# Patient Record
Sex: Female | Born: 2000 | Race: Black or African American | Hispanic: No | Marital: Single | Smoking: Current some day smoker
Health system: Southern US, Community
[De-identification: ages and names within clinical notes are randomized; demographics above are authoritative.]

## PROBLEM LIST (undated history)

## (undated) DIAGNOSIS — R519 Headache, unspecified: Secondary | ICD-10-CM

## (undated) DIAGNOSIS — R51 Headache: Secondary | ICD-10-CM

## (undated) DIAGNOSIS — F419 Anxiety disorder, unspecified: Secondary | ICD-10-CM

---
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
## 2021-07-02 ENCOUNTER — Encounter: Payer: Self-pay | Admitting: Obstetrics and Gynecology

## 2021-07-19 ENCOUNTER — Encounter: Payer: Self-pay | Admitting: Obstetrics & Gynecology

## 2022-08-17 ENCOUNTER — Ambulatory Visit: Payer: Medicaid Other | Admitting: Family Medicine

## 2023-11-04 ENCOUNTER — Encounter (HOSPITAL_COMMUNITY): Payer: Self-pay

## 2023-11-04 ENCOUNTER — Inpatient Hospital Stay (HOSPITAL_COMMUNITY)
Admission: EM | Admit: 2023-11-04 | Discharge: 2023-11-07 | DRG: 918 | Disposition: A | Discharge status: Other Acute Inpatient Hospital | Attending: Internal Medicine | Admitting: Internal Medicine

## 2023-11-04 ENCOUNTER — Other Ambulatory Visit: Payer: Self-pay

## 2023-11-04 DIAGNOSIS — R059 Cough, unspecified: Secondary | ICD-10-CM | POA: Diagnosis present

## 2023-11-04 DIAGNOSIS — T43621A Poisoning by amphetamines, accidental (unintentional), initial encounter: Secondary | ICD-10-CM | POA: Diagnosis present

## 2023-11-04 DIAGNOSIS — F1290 Cannabis use, unspecified, uncomplicated: Secondary | ICD-10-CM | POA: Diagnosis present

## 2023-11-04 DIAGNOSIS — Z79899 Other long term (current) drug therapy: Secondary | ICD-10-CM | POA: Diagnosis not present

## 2023-11-04 DIAGNOSIS — D72829 Elevated white blood cell count, unspecified: Secondary | ICD-10-CM | POA: Diagnosis present

## 2023-11-04 DIAGNOSIS — F17290 Nicotine dependence, other tobacco product, uncomplicated: Secondary | ICD-10-CM | POA: Diagnosis present

## 2023-11-04 DIAGNOSIS — R451 Restlessness and agitation: Secondary | ICD-10-CM | POA: Diagnosis present

## 2023-11-04 DIAGNOSIS — F419 Anxiety disorder, unspecified: Secondary | ICD-10-CM | POA: Diagnosis present

## 2023-11-04 DIAGNOSIS — Z9151 Personal history of suicidal behavior: Secondary | ICD-10-CM | POA: Diagnosis not present

## 2023-11-04 DIAGNOSIS — J45909 Unspecified asthma, uncomplicated: Secondary | ICD-10-CM | POA: Diagnosis present

## 2023-11-04 DIAGNOSIS — T391X4A Poisoning by 4-Aminophenol derivatives, undetermined, initial encounter: Secondary | ICD-10-CM | POA: Diagnosis present

## 2023-11-04 DIAGNOSIS — R000 Tachycardia, unspecified: Secondary | ICD-10-CM | POA: Diagnosis present

## 2023-11-04 DIAGNOSIS — F333 Major depressive disorder, recurrent, severe with psychotic symptoms: Secondary | ICD-10-CM | POA: Diagnosis present

## 2023-11-04 DIAGNOSIS — T50904A Poisoning by unspecified drugs, medicaments and biological substances, undetermined, initial encounter: Principal | ICD-10-CM | POA: Diagnosis present

## 2023-11-04 DIAGNOSIS — R0682 Tachypnea, not elsewhere classified: Secondary | ICD-10-CM | POA: Diagnosis present

## 2023-11-04 DIAGNOSIS — E876 Hypokalemia: Secondary | ICD-10-CM | POA: Diagnosis present

## 2023-11-04 DIAGNOSIS — T43634A Poisoning by methylphenidate, undetermined, initial encounter: Principal | ICD-10-CM | POA: Diagnosis present

## 2023-11-04 DIAGNOSIS — Z1152 Encounter for screening for COVID-19: Secondary | ICD-10-CM | POA: Diagnosis not present

## 2023-11-04 DIAGNOSIS — F332 Major depressive disorder, recurrent severe without psychotic features: Secondary | ICD-10-CM | POA: Diagnosis present

## 2023-11-04 LAB — COMPREHENSIVE METABOLIC PANEL
ALT: 15 U/L (ref 0–44)
AST: 22 U/L (ref 15–41)
Albumin: 4 g/dL (ref 3.5–5.0)
Alkaline Phosphatase: 106 U/L (ref 38–126)
Anion gap: 12 (ref 5–15)
BUN: 9 mg/dL (ref 6–20)
CO2: 22 mmol/L (ref 22–32)
Calcium: 9.1 mg/dL (ref 8.9–10.3)
Chloride: 101 mmol/L (ref 98–111)
Creatinine, Ser: 0.98 mg/dL (ref 0.44–1.00)
GFR, Estimated: >60 mL/min (ref >60)
Glucose, Bld: 103 mg/dL — ABNORMAL HIGH (ref 70–99)
Potassium: 3.2 mmol/L — ABNORMAL LOW (ref 3.5–5.1)
Sodium: 135 mmol/L (ref 135–145)
Total Bilirubin: 1.8 mg/dL — ABNORMAL HIGH (ref 0.0–1.2)
Total Protein: 8 g/dL (ref 6.5–8.1)

## 2023-11-04 LAB — HCG, SERUM, QUALITATIVE: Preg, Serum: NEGATIVE

## 2023-11-04 LAB — ETHANOL: Alcohol, Ethyl (B): <10 mg/dL (ref <10)

## 2023-11-04 LAB — ACETAMINOPHEN LEVEL: Acetaminophen (Tylenol), Serum: <10 ug/mL — ABNORMAL LOW (ref 10–30)

## 2023-11-04 LAB — SALICYLATE LEVEL: Salicylate Lvl: <7 mg/dL — ABNORMAL LOW (ref 7.0–30.0)

## 2023-11-04 MED ORDER — LORAZEPAM 2 MG/ML IJ SOLN
1.0000 mg | Freq: Once | INTRAMUSCULAR | Status: AC
  Administered 2023-11-04: 1 mg via INTRAVENOUS
  Filled 2023-11-04: qty 1

## 2023-11-04 MED ORDER — POTASSIUM CHLORIDE 10 MEQ/100ML IV SOLN
10.0000 meq | INTRAVENOUS | Status: AC
  Administered 2023-11-05 (×4): 10 meq via INTRAVENOUS
  Filled 2023-11-04 (×4): qty 100

## 2023-11-04 MED ORDER — MAGNESIUM SULFATE 2 GM/50ML IV SOLN
2.0000 g | Freq: Once | INTRAVENOUS | Status: AC
  Administered 2023-11-05: 2 g via INTRAVENOUS
  Filled 2023-11-04: qty 50

## 2023-11-04 MED ORDER — SODIUM CHLORIDE 0.9 % IV BOLUS
1000.0000 mL | Freq: Once | INTRAVENOUS | Status: AC
  Administered 2023-11-04: 1000 mL via INTRAVENOUS

## 2023-11-04 MED ORDER — SODIUM CHLORIDE 0.9 % IV SOLN: INTRAVENOUS | Status: DC

## 2023-11-04 MED ORDER — MAGNESIUM CHLORIDE 64 MG PO TBEC
1.0000 | DELAYED_RELEASE_TABLET | Freq: Once | ORAL | Status: DC
  Filled 2023-11-04: qty 1

## 2023-11-04 MED ORDER — ZIPRASIDONE MESYLATE 20 MG IM SOLR
20.0000 mg | Freq: Once | INTRAMUSCULAR | Status: AC
  Administered 2023-11-05: 20 mg via INTRAMUSCULAR
  Filled 2023-11-04: qty 20

## 2023-11-04 MED ORDER — POTASSIUM CHLORIDE CRYS ER 20 MEQ PO TBCR
40.0000 meq | EXTENDED_RELEASE_TABLET | Freq: Once | ORAL | Status: AC
  Administered 2023-11-04: 40 meq via ORAL
  Filled 2023-11-04: qty 2

## 2023-11-04 NOTE — ED Notes (Signed)
 This RN spoke with poison control regarding the pt ingesting 120 mL of Quillivant solution PTA. Poison control recommended repeating EKG, tylenol level, mag, and CK 4 hours after initial levels were drawn. They warned to look out for urinary retention, elevated temperature, and possible seizure like activity and agitation. Recommended to be liberal with the benzo dosing, and replace mag and potassium to high levels (potassium to 4.2 and mag to 2.2). Recommended to observe for a minimum of 8 hours. Recommended that if QRS widens, given bicarb boluses.

## 2023-11-04 NOTE — ED Triage Notes (Signed)
 BIB EMS from home for potential overdose. Pt was dropped off at grandmothers house yesterday, pt was missing for a few hours, when she came back she had drank all of her ADHD meds. Grandmother reports pt talking about self harming herself. Pt has racing thoughts. Pt denies SI/HI. Pt refused all vitals except BP and HR. Pt c/o HA.

## 2023-11-04 NOTE — H&P (Addendum)
 History and Physical    Patient: Kelly Baker GNF:[id number] DOB: 10/09/2000 DOA: 11/04/2023 DOS: the patient was seen and examined on 11/04/2023 PCP: Dahlia Byes, MD  Patient coming from: Home  Chief Complaint:  Chief Complaint  Patient presents with   Ingestion   HPI: Kelly Baker is a 23 y.o. female with medical history significant of depression, anxiety, asthma, who was brought in with confusion after ingestion of 120 mL of Quillivant solution and unknown quantities of methylphenidate tablets,  as well as Tylenol.  The grandmother called EMS due to concern for bizarre behavior.  She has been having racing thoughts and pressured speech.  Patient denies suicidal or homicidal intent but family reported that she was trying to hurt herself.  She was equally committed to behavioral health for suicide attempt in 2019.  History is difficult to obtain from the patient as she is not rational and unable to speak straight.  Patient seen in the ER and evaluated.  She has significant tachycardia and tachypnea.  Also mild hypokalemia.  No nausea vomiting or diarrhea.  At this point Poison control has been contacted.  Patient being admitted to the hospital for observation and monitoring.  Review of Systems: As mentioned in the history of present illness. All other systems reviewed and are negative. Past Medical History:  Diagnosis Date   Anxiety    Asthma    Headache    History reviewed. No pertinent surgical history. Social History:  reports that she has been smoking e-cigarettes. She has never used smokeless tobacco. She reports current alcohol use. She reports current drug use.  Allergies  Allergen Reactions   Other Other (See Comments)    Dog dander pt reports sneezing    Family History  Problem Relation Age of Onset   Healthy Mother     Prior to Admission medications   Medication Sig Start Date End Date Taking? Authorizing Provider  albuterol (VENTOLIN HFA) 108 (90  Base) MCG/ACT inhaler  04/08/19   [provider]  cetirizine (ZYRTEC) 10 MG tablet TK 1 T PO HS 04/08/19   [provider]  etonogestrel (NEXPLANON) 68 MG IMPL implant 1 each (68 mg total) by Subdermal route once.    Verneda Skill, FNP  ondansetron (ZOFRAN ODT) 4 MG disintegrating tablet Take 1 tablet (4 mg total) by mouth every 8 (eight) hours as needed for nausea or vomiting. 12/30/19   Vicki Mallet, MD  polyethylene glycol powder (GLYCOLAX/MIRALAX) powder 1 capful in 8 oz of liquid po daily x 2 weeks, then 1/2 capful as needed for constipation and pain Patient not taking: Reported on 01/31/2018 07/23/11   Niel Hummer, MD    Physical Exam: Vitals:   11/04/23 2116 11/04/23 2126 11/04/23 2215 11/04/23 2245  BP: (!) 140/88     Pulse: (!) 114  (!) 129   Resp:   19 (!) 23  SpO2:   100%   Weight:  49.9 kg    Height:  5\' 2"  (1.575 m)     Constitutional: Confused with pressured speech and difficult historian,  Eyes: PERRL, lids and conjunctivae normal ENMT: Mucous membranes are moist. Posterior pharynx clear of any exudate or lesions.Normal dentition.  Neck: normal, supple, no masses, no thyromegaly Respiratory: clear to auscultation bilaterally, no wheezing, no crackles. Normal respiratory effort. No accessory muscle use.  Cardiovascular: Sinus tachycardia, no murmurs / rubs / gallops. No extremity edema. 2+ pedal pulses. No carotid bruits.  Abdomen: no tenderness, no masses palpated. No hepatosplenomegaly. Bowel  sounds positive.  Musculoskeletal: Good range of motion, no joint swelling or tenderness, Skin: no rashes, lesions, ulcers. No induration Neurologic: CN 2-12 grossly intact. Sensation intact, DTR normal. Strength 5/5 in all 4.  Psychiatric: Mild confusion, tangential, pressured speech  Data Reviewed:  Blood pressure 140/88 pulse 129, respiratory rate of 23, potassium 3.2 glucose 103 .  Tylenol level less than 10 salicylate level less than 7 glucose 103  pregnancy test is negative urine drug screen pending.  Assessment and Plan:  #1 drug overdose with methylphenidate: Patient denies suicidal or homicidal intent.  Patient however has history of depression.  At this point she is not a reliable historian.  Admit the patient for observation, involuntary committed due to attempt to hurt herself.  Suicide precaution.  Consult psychiatry for assessment.  Poison control contacted.  Recommendation is to repeat EKG, monitor Tylenol level, check magnesium and CK level after 4 hours.  Look out for urinary retention, elevated temperature, possible seizure-like activity and agitation.  Patient is already agitated.  Also recommendation is to be liberal with the benzodiazepine, replace magnesium and potassium with a target of potassium of 4.2 magnesium 2.2.  Monitor QRS interval.  We will get a full drug screen to ascertain what she may have in her system.  Patient may require inpatient psych commitment.  #2 hypokalemia: Continue to replete potassium  #3 depression with anxiety: Psych consult.  Continue monitoring.  #4 history of asthma: Continue home breathing treatments    Advance Care Planning:   Code Status: Full Code   Consults: Psychiatry consult and poison control  Family Communication: Grandmother  Severity of Illness: The appropriate patient status for this patient is OBSERVATION. Observation status is judged to be reasonable and necessary in order to provide the required intensity of service to ensure the patient's safety. The patient's presenting symptoms, physical exam findings, and initial radiographic and laboratory data in the context of their medical condition is felt to place them at decreased risk for further clinical deterioration. Furthermore, it is anticipated that the patient will be medically stable for discharge from the hospital within 2 midnights of admission.   AuthorLonia Blood, MD 11/04/2023 10:58 PM  For on call review  [email or web address].

## 2023-11-04 NOTE — ED Provider Notes (Addendum)
 [place] EMERGENCY DEPARTMENT AT [facility] Provider Note   CSN: [id number] Arrival date & time: 11/04/23  2105     History  Chief Complaint  Patient presents with   Ingestion    Kelly Baker is a 23 y.o. female.  23 year old female here after possible ingestion of methylphenidate tablets as well as Tylenol.  EMS was called by patient's grandmother due to concern for her bizarre behavior.  Patient has had racing thoughts and pressured speech.  She denies any suicidal or homicidal intent.  Very difficult historian at this time.  No emesis appreciated.  Refuses to give further history       Home Medications Prior to Admission medications   Medication Sig Start Date End Date Taking? Authorizing Provider  albuterol (VENTOLIN HFA) 108 (90 Base) MCG/ACT inhaler  04/08/19   [provider]  cetirizine (ZYRTEC) 10 MG tablet TK 1 T PO HS 04/08/19   [provider]  etonogestrel (NEXPLANON) 68 MG IMPL implant 1 each (68 mg total) by Subdermal route once.    Verneda Skill, FNP  ondansetron (ZOFRAN ODT) 4 MG disintegrating tablet Take 1 tablet (4 mg total) by mouth every 8 (eight) hours as needed for nausea or vomiting. 12/30/19   Vicki Mallet, MD  polyethylene glycol powder (GLYCOLAX/MIRALAX) powder 1 capful in 8 oz of liquid po daily x 2 weeks, then 1/2 capful as needed for constipation and pain Patient not taking: Reported on 01/31/2018 07/23/11   Niel Hummer, MD      Allergies    Other    Review of Systems   Review of Systems  Unable to perform ROS: Psychiatric disorder    Physical Exam Updated Vital Signs BP (!) 140/88   Pulse (!) 114   Ht 1.575 m (5\' 2" )   Wt 49.9 kg   BMI 20.12 kg/m  Physical Exam Vitals and nursing note reviewed.  Constitutional:      General: She is not in acute distress.    Appearance: Normal appearance. She is well-developed. She is not toxic-appearing.  HENT:     Head: Normocephalic and atraumatic.   Eyes:     General: Lids are normal.     Conjunctiva/sclera: Conjunctivae normal.     Pupils: Pupils are equal, round, and reactive to light.  Neck:     Thyroid: No thyroid mass.     Trachea: No tracheal deviation.  Cardiovascular:     Rate and Rhythm: Normal rate and regular rhythm.     Heart sounds: Normal heart sounds. No murmur heard.    No gallop.  Pulmonary:     Effort: Pulmonary effort is normal. No respiratory distress.     Breath sounds: Normal breath sounds. No stridor. No decreased breath sounds, wheezing, rhonchi or rales.  Abdominal:     General: There is no distension.     Palpations: Abdomen is soft.     Tenderness: There is no abdominal tenderness. There is no rebound.  Musculoskeletal:        General: No tenderness. Normal range of motion.     Cervical back: Normal range of motion and neck supple.  Skin:    General: Skin is warm and dry.     Findings: No abrasion or rash.  Neurological:     General: No focal deficit present.     Mental Status: She is alert and oriented to person, place, and time. Mental status is at baseline.     GCS: GCS eye subscore  is 4. GCS verbal subscore is 5. GCS motor subscore is 6.     Cranial Nerves: No cranial nerve deficit.     Sensory: No sensory deficit.     Motor: Motor function is intact.  Psychiatric:        Attention and Perception: Attention normal.        Mood and Affect: Mood is anxious and elated.        Speech: Speech is rapid and pressured.        Behavior: Behavior is hyperactive.     ED Results / Procedures / Treatments   Labs (all labs ordered are listed, but only abnormal results are displayed) Labs Reviewed  RAPID URINE DRUG SCREEN, HOSP PERFORMED  ETHANOL  ACETAMINOPHEN LEVEL  SALICYLATE LEVEL  CBC WITH DIFFERENTIAL/PLATELET  COMPREHENSIVE METABOLIC PANEL  HCG, SERUM, QUALITATIVE    EKG EKG Interpretation Date/Time:  Saturday November 04 2023 22:16:33 EDT Ventricular Rate:  146 PR Interval:  115 QRS  Duration:  66 QT Interval:  273 QTC Calculation: 426 R Axis:   63  Text Interpretation: Sinus tachycardia Ventricular premature complex LAE, consider biatrial enlargement Borderline repol abnormality, diffuse leads Confirmed by Lorre Nick ([id number]) on 11/04/2023 10:28:16 PM  Radiology No results found.  Procedures Procedures    Medications Ordered in ED Medications - No data to display  ED Course/ Medical Decision Making/ A&P                                 Medical Decision Making Amount and/or Complexity of Data Reviewed Labs: ordered. ECG/medicine tests: ordered.  Risk Prescription drug management.   Patient had EKG which shows sinus tach at 146.  Patient does not give a clear history about whether or not she drank a bottle of methylphenidate however it appears so.  She is tachycardic at this time which is consistent with this.  She also has evidence of mania at this time.    10:55 PM Patient will be given IV fluids as well as IV Ativan.  Awaiting bracing for recommendations.  Will treat with supportive care at this time.  She will require admission for observation.  Discussed with Dr. Mikeal Hawthorne from hospitalist team he will admit   CRITICAL CARE Performed by: Toy Baker Total critical care time: 60 minutes Critical care time was exclusive of separately billable procedures and treating other patients. Critical care was necessary to treat or prevent imminent or life-threatening deterioration. Critical care was time spent personally by me on the following activities: development of treatment plan with patient and/or surrogate as well as nursing, discussions with consultants, evaluation of patient's response to treatment, examination of patient, obtaining history from patient or surrogate, ordering and performing treatments and interventions, ordering and review of laboratory studies, ordering and review of radiographic studies, pulse oximetry and re-evaluation of patient's  condition.      Final Clinical Impression(s) / ED Diagnoses Final diagnoses:  None    Rx / DC Orders ED Discharge Orders     None         Lorre Nick, MD 11/04/23 2229    Lorre Nick, MD 11/04/23 2255

## 2023-11-05 ENCOUNTER — Inpatient Hospital Stay (HOSPITAL_COMMUNITY)

## 2023-11-05 DIAGNOSIS — F332 Major depressive disorder, recurrent severe without psychotic features: Secondary | ICD-10-CM | POA: Diagnosis not present

## 2023-11-05 DIAGNOSIS — T43621A Poisoning by amphetamines, accidental (unintentional), initial encounter: Secondary | ICD-10-CM | POA: Diagnosis not present

## 2023-11-05 LAB — RAPID URINE DRUG SCREEN, HOSP PERFORMED
Amphetamines: NOT DETECTED
Barbiturates: NOT DETECTED
Benzodiazepines: NOT DETECTED
Cocaine: POSITIVE — AB
Opiates: NOT DETECTED
Tetrahydrocannabinol: POSITIVE — AB

## 2023-11-05 LAB — CBC WITH DIFFERENTIAL/PLATELET
Abs Immature Granulocytes: 0.09 10*3/uL — ABNORMAL HIGH (ref 0.00–0.07)
Basophils Absolute: 0.1 10*3/uL (ref 0.0–0.1)
Basophils Relative: 0 %
Eosinophils Absolute: 0.2 10*3/uL (ref 0.0–0.5)
Eosinophils Relative: 1 %
HCT: 39.5 % (ref 36.0–46.0)
Hemoglobin: 13.3 g/dL (ref 12.0–15.0)
Immature Granulocytes: 1 %
Lymphocytes Relative: 13 %
Lymphs Abs: 1.9 10*3/uL (ref 0.7–4.0)
MCH: 31.4 pg (ref 26.0–34.0)
MCHC: 33.7 g/dL (ref 30.0–36.0)
MCV: 93.4 fL (ref 80.0–100.0)
Monocytes Absolute: 1.6 10*3/uL — ABNORMAL HIGH (ref 0.1–1.0)
Monocytes Relative: 11 %
Neutro Abs: 11 10*3/uL — ABNORMAL HIGH (ref 1.7–7.7)
Neutrophils Relative %: 74 %
Platelets: 278 10*3/uL (ref 150–400)
RBC: 4.23 MIL/uL (ref 3.87–5.11)
RDW: 12.8 % (ref 11.5–15.5)
WBC: 14.8 10*3/uL — ABNORMAL HIGH (ref 4.0–10.5)
nRBC: 0 % (ref 0.0–0.2)

## 2023-11-05 LAB — RESP PANEL BY RT-PCR (RSV, FLU A&B, COVID)  RVPGX2
Influenza A by PCR: NEGATIVE
Influenza B by PCR: NEGATIVE
Resp Syncytial Virus by PCR: NEGATIVE
SARS Coronavirus 2 by RT PCR: NEGATIVE

## 2023-11-05 LAB — COMPREHENSIVE METABOLIC PANEL
ALT: 11 U/L (ref 0–44)
AST: 16 U/L (ref 15–41)
Albumin: 3.2 g/dL — ABNORMAL LOW (ref 3.5–5.0)
Alkaline Phosphatase: 84 U/L (ref 38–126)
Anion gap: 7 (ref 5–15)
BUN: 8 mg/dL (ref 6–20)
CO2: 20 mmol/L — ABNORMAL LOW (ref 22–32)
Calcium: 8.4 mg/dL — ABNORMAL LOW (ref 8.9–10.3)
Chloride: 110 mmol/L (ref 98–111)
Creatinine, Ser: 0.81 mg/dL (ref 0.44–1.00)
GFR, Estimated: >60 mL/min (ref >60)
Glucose, Bld: 93 mg/dL (ref 70–99)
Potassium: 4.3 mmol/L (ref 3.5–5.1)
Sodium: 137 mmol/L (ref 135–145)
Total Bilirubin: 1.3 mg/dL — ABNORMAL HIGH (ref 0.0–1.2)
Total Protein: 6.4 g/dL — ABNORMAL LOW (ref 6.5–8.1)

## 2023-11-05 LAB — ACETAMINOPHEN LEVEL: Acetaminophen (Tylenol), Serum: <10 ug/mL — ABNORMAL LOW (ref 10–30)

## 2023-11-05 LAB — MAGNESIUM: Magnesium: 1.8 mg/dL (ref 1.7–2.4)

## 2023-11-05 LAB — MRSA NEXT GEN BY PCR, NASAL: MRSA by PCR Next Gen: NOT DETECTED

## 2023-11-05 LAB — HIV ANTIBODY (ROUTINE TESTING W REFLEX): HIV Screen 4th Generation wRfx: NONREACTIVE

## 2023-11-05 MED ORDER — BENZONATATE 100 MG PO CAPS
100.0000 mg | ORAL_CAPSULE | Freq: Three times a day (TID) | ORAL | Status: DC | PRN
  Administered 2023-11-05 – 2023-11-07 (×5): 100 mg via ORAL
  Filled 2023-11-05 (×6): qty 1

## 2023-11-05 MED ORDER — ALBUTEROL SULFATE HFA 108 (90 BASE) MCG/ACT IN AERS: 1.0000 | INHALATION_SPRAY | Freq: Four times a day (QID) | RESPIRATORY_TRACT | Status: DC | PRN

## 2023-11-05 MED ORDER — PANTOPRAZOLE SODIUM 40 MG IV SOLR: 40.0000 mg | Freq: Two times a day (BID) | INTRAVENOUS | Status: DC

## 2023-11-05 MED ORDER — ONDANSETRON HCL 4 MG PO TABS: 4.0000 mg | ORAL_TABLET | Freq: Four times a day (QID) | ORAL | Status: DC | PRN

## 2023-11-05 MED ORDER — ARIPIPRAZOLE 5 MG PO TABS
7.5000 mg | ORAL_TABLET | Freq: Every day | ORAL | Status: DC
  Administered 2023-11-05 – 2023-11-07 (×3): 7.5 mg via ORAL
  Filled 2023-11-05 (×3): qty 2

## 2023-11-05 MED ORDER — NICOTINE 21 MG/24HR TD PT24
21.0000 mg | MEDICATED_PATCH | Freq: Every day | TRANSDERMAL | Status: DC
  Administered 2023-11-05 – 2023-11-07 (×3): 21 mg via TRANSDERMAL
  Filled 2023-11-05 (×4): qty 1

## 2023-11-05 MED ORDER — ALBUTEROL SULFATE (2.5 MG/3ML) 0.083% IN NEBU: 2.5000 mg | INHALATION_SOLUTION | Freq: Four times a day (QID) | RESPIRATORY_TRACT | Status: DC | PRN

## 2023-11-05 MED ORDER — CHLORHEXIDINE GLUCONATE CLOTH 2 % EX PADS
6.0000 | MEDICATED_PAD | Freq: Every day | CUTANEOUS | Status: DC
Start: 2023-11-05 — End: 2023-11-07
  Administered 2023-11-05 – 2023-11-07 (×3): 6 via TOPICAL

## 2023-11-05 MED ORDER — HYDRALAZINE HCL 20 MG/ML IJ SOLN: 10.0000 mg | Freq: Four times a day (QID) | INTRAMUSCULAR | Status: DC | PRN

## 2023-11-05 MED ORDER — SODIUM CHLORIDE 0.9 % IV BOLUS
500.0000 mL | Freq: Once | INTRAVENOUS | Status: AC
  Administered 2023-11-05: 500 mL via INTRAVENOUS

## 2023-11-05 MED ORDER — ONDANSETRON HCL 4 MG/2ML IJ SOLN: 4.0000 mg | Freq: Four times a day (QID) | INTRAMUSCULAR | Status: DC | PRN

## 2023-11-05 MED ORDER — SERTRALINE HCL 50 MG PO TABS
50.0000 mg | ORAL_TABLET | Freq: Every day | ORAL | Status: DC
  Administered 2023-11-05 – 2023-11-07 (×3): 50 mg via ORAL
  Filled 2023-11-05 (×3): qty 1

## 2023-11-05 MED ORDER — ORAL CARE MOUTH RINSE: 15.0000 mL | OROMUCOSAL | Status: DC | PRN

## 2023-11-05 MED ORDER — LORAZEPAM 2 MG/ML IJ SOLN
1.0000 mg | INTRAMUSCULAR | Status: DC | PRN
Start: 2023-11-05 — End: 2023-11-07
  Administered 2023-11-05 – 2023-11-06 (×6): 1 mg via INTRAVENOUS
  Filled 2023-11-05 (×6): qty 1

## 2023-11-05 MED ORDER — DEXTROSE IN LACTATED RINGERS 5 % IV SOLN: INTRAVENOUS | Status: DC

## 2023-11-05 MED ORDER — PANTOPRAZOLE SODIUM 40 MG PO TBEC
40.0000 mg | DELAYED_RELEASE_TABLET | Freq: Two times a day (BID) | ORAL | Status: DC
  Administered 2023-11-05 – 2023-11-07 (×5): 40 mg via ORAL
  Filled 2023-11-05 (×5): qty 1

## 2023-11-05 NOTE — Consult Note (Addendum)
 [facility] Psychiatric Consult Initial  Patient Name: .Kelly Baker  MRN: [id number]  DOB: 2001-01-18  Consult Order details:  Orders (From admission, onward)     Start     Ordered   11/05/23 0809  IP CONSULT TO PSYCHIATRY       Ordering Provider: Alba Cory, MD  Provider:  (Not yet assigned)  Question Answer Comment  Location [facility]   Reason for Consult? overdose, IVC      11/05/23 0808   11/05/23 0012  IP CONSULT TO PSYCHIATRY       Ordering Provider: Rometta Emery, MD  Provider:  (Not yet assigned)  Question Answer Comment  Location [facility]   Reason for Consult? overdose, Depression      11/05/23 0011             Mode of Visit: In person    Psychiatry Consult Evaluation  Service Date: November 05, 2023 LOS:  LOS: 1 day  Chief Complaint "I feel depressed and anxious all the time, and I've been experiencing frequent suicidal thoughts lately, but I did not overdose on any medication"  Primary Psychiatric Diagnoses  Major depressive disorder with psychotic features.  2.  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    Assessment  Kelly Baker is a 23 y.o. female admitted: Medicallyfor 11/04/2023  9:07 PM for intentional overdose . She carries the psychiatric diagnoses of major depression and GAD but denies past medical history. Psychiatry consulted for evaluation of depression in the context of recent overdose.   Her current presentation of recent overdose, ongoing depressed mood, low energy level, lack of motivation, hopelessness, helplessness, crying spells, delusions, and hallucinations is most consistent with major depressive disorder with psychotic features. Patient also presents with extreme anxiety, nervousness, apprehension, racing thoughts, and worry about multiple things in general. She has been abusing multiple substances including stimulant, cocaine and  marijuana which could also explains the ongoing mood and psychotic symptoms. She meets criteria for inpatient admission based on recent overdose,ongoing depressive symptoms, active suicide ideation and poor social supports. She is currently a risk of harm to herself. Patient is not currently taking any psychotropic medications and is not following up with any psychiatrist. Lynnda Shields with which the patient overdosed was obtained from the streets as evidenced by reports from the mother. On initial examination, patient is awake, alert and oriented x 2. She appears confused, and incoherent when narrating the incident that led to her admission. Her speech is low volume with poor eye contact. Mood is "depressed and dysphoric" with labile affect. There is ongoing auditory hallucinations and paranoid delusions.  Please see plan below for detailed recommendations.   Diagnoses:  Active Hospital problems: Principal Problem:   Overdose by amphetamine, accidental or unintentional, initial encounter (HCC) Active Problems:   MDD (major depressive disorder), recurrent severe, without psychosis (HCC)   Asthma, chronic   Anxiety disorder   Hypokalemia   Drug overdose of undetermined intent    Plan   ## Psychiatric Medication Recommendations:  --Start Abilify 7.5 mg daily for psychosis --Start Zoloft 50 mg daily for depression --Consider Haldol 5 mg PO/IM and Ativan 2 mg PO/IM PRN for agitation  --Recommend inpatient psychiatric admission once she is medically stabilized --Consider TOC/Social worker consult to facilitate inpatient transfer.  ## Medical Decision Making Capacity: Not specifically addressed in this encounter  ## Further Work-up:  TSH, B12, folate -- most recent EKG on 11/05/23 had QtC of  430 -- Pertinent labwork reviewed earlier this admission includes:acetaminophen-<10, salicylate-<7, Urine toxicology positive for cocaine, THC. Note-Quillivant is not likely to show up in the routine urine  drug screen.     ## Disposition:-- We recommend inpatient psychiatric hospitalization after medical hospitalization. Patient has been involuntarily committed on 11/05/2023.   ## Behavioral / Environmental: - No specific recommendations at this time.     ## Safety and Observation Level:  - Based on my clinical evaluation, I estimate the patient to be at high risk of self harm in the current setting. - At this time, we recommend  1:1 Observation. This decision is based on my review of the chart including patient's history and current presentation, interview of the patient, mental status examination, and consideration of suicide risk including evaluating suicidal ideation, plan, intent, suicidal or self-harm behaviors, risk factors, and protective factors. This judgment is based on our ability to directly address suicide risk, implement suicide prevention strategies, and develop a safety plan while the patient is in the clinical setting. Please contact our team if there is a concern that risk level has changed.  CSSR Risk Category:C-SSRS RISK CATEGORY: No Risk  Suicide Risk Assessment: Patient has following modifiable risk factors for suicide: active suicidal ideation, untreated depression, recklessness, medication noncompliance, and lack of access to outpatient mental health resources, which we are addressing by prescribing medication. Patient has following non-modifiable or demographic risk factors for suicide: psychiatric hospitalization Patient has the following protective factors against suicide: Supportive family  Thank you for this consult request. Recommendations have been communicated to the primary team.  We will follow up at this time.   Fredonia Highland, MD       History of Present Illness  Relevant Aspects of Hospital ED Course:  Admitted on 11/04/2023 for drug overdose.   Patient Report:  Patient seen face to face in her hospital room. She is awake, alert and oriented x 2.  Patient appears confused,illogical, with incoherent thought process. She was not able to give an elaborate explanation of the circumstances that led to this admission. When asked about the overdose, patient denies overdosing on Quillivant, but states it was someone who broke into their apartment and took the medication. On further questioning, patient states the Jerelene Redden belongs to her brother, but later states the medication was prescribed for her ADHD by her psychiatrist. She appears depressed, stressed, overwhelmed, and crying excessively during evaluation.   Patient reports ongoing depressed mood, low energy level, lack of motivation, hopelessness, helplessness, with frequent thought of suicide. Patient reports "I just want to disappear one day, and I sometimes walk against the on coming traffic to end my life." Additionally, patient reports extreme anxiety, nervousness, apprehension, racing thoughts, and worry about multiple things in general. Patient also reports ongoing auditory hallucinations, she hears people call her name when no one is there. She also reports paranoid delusions feeling as if people are out to get her. When asked about the cocaine seen in her urine, patient reports she has been smoking something with her cousin lately, which may explains why her urine is positive for cocaine. She also reports daily smoking of marijuana, but denies use of other substances like opioid, PCP, K2, or crystal meth.   Collateral information from the patient's mother Lelon Mast [phone]) indicates mother does not know much about the patient recent activities because "she does not live with me and besides, she's an adult and can do whatever she likes with her life." Mother states she is  currently pregnant and have other kids to take care of hence, difficult for her to monitor the patient. She reports patient smokes marijuana daily and the Lynnda Shields does not belong to the patient saying, "she gets whatever  she want from the streets." Mother states further that patient does not follow up with any psychiatrist and is not taking any psychotropic medications.     Review of Systems  Psychiatric/Behavioral:  Positive for depression, hallucinations, substance abuse and suicidal ideas. The patient is nervous/anxious.      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  Legal Hx: denies  Living Situation:Currently lives with cousin Spiritual Hx: denies  Access to weapons/lethal means: denies    Substance History Alcohol: denies   Type of alcohol N/A Last Drink N/A Number of drinks per day N/A History of alcohol withdrawal seizures N/A History of DT's N/A Tobacco: denies  Illicit drugs: yes, cannabis, cocaine, stimulant (quillivant).  Prescription drug abuse: denies  Rehab hx: denies   Exam Findings  Physical Exam:  Vital Signs:  Temp:  [98.1 F (36.7 C)-100.3 F (37.9 C)] 98.4 F (36.9 C) (03/23 1429) Pulse Rate:  [91-136] 116 (03/23 1300) Resp:  [15-28] 20 (03/23 1300) BP: (115-140)/(73-98) 119/76 (03/23 1300) SpO2:  [96 %-100 %] 98 % (03/23 1300) Weight:  [49.9 kg] 49.9 kg (03/22 2126) Blood pressure 119/76, pulse (!) 116, temperature 98.4 F (36.9 C), temperature source Oral, resp. rate 20, height 5\' 2"  (1.575 m), weight 49.9 kg, last menstrual period 10/20/2023, SpO2 98%. Body mass index is 20.12 kg/m.  Physical Exam  Mental Status Exam: General Appearance: Guarded  Orientation:  Other:  place and person  Memory:  Immediate;    Good  Concentration:  Concentration: Fair and Attention Span: Fair  Recall:  Fair  Attention  Fair  Eye Contact:  Poor  Speech:  Normal Rate  Language:  Good  Volume:  Decreased  Mood: "depressed and dysphoric"  Affect:  Labile  Thought Process:  Irrelevant  Thought Content:  Delusions and Hallucinations: Auditory  Suicidal Thoughts:  Yes.  with intent/plan  Homicidal Thoughts:  No  Judgement:  Poor  Insight:  Lacking  Psychomotor Activity:  Normal  Akathisia:  No  Fund of Knowledge:  Fair      Assets:  Communication Skills  Cognition:  WNL  ADL's:  Intact  AIMS (if indicated):        Other History   These have been pulled in through the EMR, reviewed, and updated if appropriate.  Family History:  The patient's family history includes Healthy in her mother.  Medical History: Past Medical History:  Diagnosis Date   Anxiety    Asthma    Headache     Surgical History: History reviewed. No pertinent surgical history.   Medications:   Current Facility-Administered Medications:    albuterol (PROVENTIL) (2.5 MG/3ML) 0.083% nebulizer solution 2.5 mg, 2.5 mg, Nebulization, Q6H PRN, Regalado, Belkys A, MD   benzonatate (TESSALON) capsule 100 mg, 100 mg, Oral, TID PRN, Regalado, Belkys A, MD, 100 mg at 11/05/23 1406  dextrose 5 % in lactated ringers infusion, , Intravenous, Continuous, Rometta Emery, MD, Last Rate: 100 mL/hr at 11/05/23 0823, New Bag at 11/05/23 1610   hydrALAZINE (APRESOLINE) injection 10 mg, 10 mg, Intravenous, Q6H PRN, Mikeal Hawthorne, Mohammad L, MD   LORazepam (ATIVAN) injection 1 mg, 1 mg, Intravenous, Q4H PRN, Regalado, Belkys A, MD, 1 mg at 11/05/23 1407   nicotine (NICODERM CQ - dosed in mg/24 hours) patch 21 mg, 21 mg, Transdermal, Daily, Regalado, Belkys A, MD   ondansetron (ZOFRAN) tablet 4 mg, 4 mg, Oral, Q6H PRN **OR** ondansetron (ZOFRAN) injection 4 mg, 4 mg, Intravenous, Q6H PRN, Mikeal Hawthorne, Mohammad L, MD   pantoprazole (PROTONIX) EC tablet 40 mg,  40 mg, Oral, BID, Norva Pavlov, RPH, 40 mg at 11/05/23 1001   sodium chloride 0.9 % bolus 500 mL, 500 mL, Intravenous, Once, Regalado, Belkys A, MD  Current Outpatient Medications:    etonogestrel (NEXPLANON) 68 MG IMPL implant, 1 each (68 mg total) by Subdermal route once., Disp: 1 each, Rfl: 0   albuterol (VENTOLIN HFA) 108 (90 Base) MCG/ACT inhaler, , Disp: , Rfl:    cetirizine (ZYRTEC) 10 MG tablet, TK 1 T PO HS (Patient not taking: Reported on 11/05/2023), Disp: , Rfl:    ondansetron (ZOFRAN ODT) 4 MG disintegrating tablet, Take 1 tablet (4 mg total) by mouth every 8 (eight) hours as needed for nausea or vomiting. (Patient not taking: Reported on 11/05/2023), Disp: 10 tablet, Rfl: 0   polyethylene glycol powder (GLYCOLAX/MIRALAX) powder, 1 capful in 8 oz of liquid po daily x 2 weeks, then 1/2 capful as needed for constipation and pain (Patient not taking: Reported on 01/31/2018), Disp: 255 g, Rfl: 0  Allergies: Allergies  Allergen Reactions   Other Other (See Comments)    Dog dander pt reports sneezing    Fredonia Highland, MD

## 2023-11-05 NOTE — ED Notes (Signed)
 Pt given ham sandwich and something to drink

## 2023-11-05 NOTE — ED Notes (Signed)
 Pt given breakfast tray

## 2023-11-05 NOTE — Progress Notes (Signed)
 Poison control called and asking for condition and vitals. She recommend and relay to MD that we can increase dosage of Benzo if needed for agitation.

## 2023-11-05 NOTE — ED Notes (Signed)
 Pts IV was infiltrated with IV normal saline running. IV was removed and documented. Ice pack was applied to affected area.

## 2023-11-05 NOTE — ED Notes (Signed)
 Pt's sitter advised medical staff that the pt removed a vape from her scrubs prior to getting a chest X-ray. Medical staff had security wand the pt after securing the vape with her belongings.

## 2023-11-05 NOTE — ED Notes (Signed)
Psych Doctor at bedside

## 2023-11-05 NOTE — ED Notes (Signed)
 Pt woke up from sleep asking to use restroom as I was attempting to unplug iv pump from wall pt stoop up and urinated on the floor pt cleaned up given scrub pants and placed back in bed unable to obtain urine specimen at this time

## 2023-11-05 NOTE — ED Notes (Signed)
 PT awakened from sleep agitated and urinated on floor. Could not attain urine sample. PT soiled scrub pants were removed. PT went back to sleep.

## 2023-11-05 NOTE — Progress Notes (Signed)
 PROGRESS NOTE    Kelly Baker  ZOX:[id number] DOB: [birth year] DOA: 11/04/2023 PCP: Dahlia Byes, MD   Brief Narrative: 22 past medical history significant for depression, anxiety, asthma was brought in for confusion after ingestion of unknown quantities of methylphenidate tablets as well as Tylenol.  Patient was having bizarre behavior, racing thoughts, pressured speech.  EMS was contacted.  Patient was IVC.  Patient admitted for methylphenidate overdose   Assessment & Plan:   Principal Problem:   Overdose by amphetamine, accidental or unintentional, initial encounter [facility]) Active Problems:   MDD (major depressive disorder), recurrent severe, without psychosis (HCC)   Asthma, chronic   Anxiety disorder   Hypokalemia   Drug overdose of undetermined intent   1-Drug overdose with methylphenidate -Patient presented after overdose of methylphenidate and Tylenol, unknown amount -Poison control was contacted and recommended Tylenol levels at 4 hours, observation, CK level, EKG. -Ativan as needed -Continue with IV fluids -Psych  consulted -Patient was IVC on admission -Monitor on telemetry.  -Tylenol level less than 10 -UDS positive for cocaine and marihuana.   Depression with anxiety -psych consulted  Hypokalemia -Replaced.   History of asthma -PRN albuterol.   Cough; chest x ray negative.  Tessalon Pearl PRN   Estimated body mass index is 20.12 kg/m as calculated from the following:   Height as of this encounter: 5\' 2"  (1.575 m).   Weight as of this encounter: 49.9 kg.   DVT prophylaxis: SCD Code Status: Full code Family Communication: Disposition Plan:  Status is: Inpatient Remains inpatient appropriate because: management of Overdose    Consultants:  Psych  Procedures:  none  Antimicrobials:    Subjective: She is alert, she was asking about her phone.  She said I only took two tablet-- didn't know name pill.  She denies chest pain,  dyspnea, abdominal pain.    Objective: Vitals:   11/05/23 0441 11/05/23 0456 11/05/23 0646 11/05/23 0700  BP: 115/78   116/73  Pulse: 91   (!) 108  Resp: (!) 24   19  Temp: 98.3 F (36.8 C) 100.3 F (37.9 C) 98.5 F (36.9 C)   TempSrc: Oral Oral Oral   SpO2: 100%   97%  Weight:      Height:        Intake/Output Summary (Last 24 hours) at 11/05/2023 0709 Last data filed at 11/05/2023 0501 Gross per 24 hour  Intake 1450 ml  Output --  Net 1450 ml   Filed Weights   11/04/23 2126  Weight: 49.9 kg    Examination:  General exam: Appears calm and comfortable  Respiratory system: Clear to auscultation. Respiratory effort normal. Cardiovascular system: S1 & S2 heard, RRR. No JVD, murmurs, rubs, gallops or clicks. No pedal edema. Gastrointestinal system: Abdomen is nondistended, soft and nontender. No organomegaly or masses felt. Normal bowel sounds heard. Central nervous system: Alert and oriented.  Extremities: Symmetric 5 x 5 power.   Data Reviewed: I have personally reviewed following labs and imaging studies  CBC: No results for input(s): "WBC", "NEUTROABS", "HGB", "HCT", "MCV", "PLT" in the last 168 hours. Basic Metabolic Panel: Recent Labs  Lab 11/04/23 2205  NA 135  K 3.2*  CL 101  CO2 22  GLUCOSE 103*  BUN 9  CREATININE 0.98  CALCIUM 9.1  MG 1.8   GFR: Estimated Creatinine Clearance: 71.5 mL/min (by C-G formula based on SCr of 0.98 mg/dL). Liver Function Tests: Recent Labs  Lab 11/04/23 2205  AST 22  ALT 15  ALKPHOS  106  BILITOT 1.8*  PROT 8.0  ALBUMIN 4.0   No results for input(s): "LIPASE", "AMYLASE" in the last 168 hours. No results for input(s): "AMMONIA" in the last 168 hours. Coagulation Profile: No results for input(s): "INR", "PROTIME" in the last 168 hours. Cardiac Enzymes: No results for input(s): "CKTOTAL", "CKMB", "CKMBINDEX", "TROPONINI" in the last 168 hours. BNP (last 3 results) No results for input(s): "PROBNP" in the last  8760 hours. HbA1C: No results for input(s): "HGBA1C" in the last 72 hours. CBG: No results for input(s): "GLUCAP" in the last 168 hours. Lipid Profile: No results for input(s): "CHOL", "HDL", "LDLCALC", "TRIG", "CHOLHDL", "LDLDIRECT" in the last 72 hours. Thyroid Function Tests: No results for input(s): "TSH", "T4TOTAL", "FREET4", "T3FREE", "THYROIDAB" in the last 72 hours. Anemia Panel: No results for input(s): "VITAMINB12", "FOLATE", "FERRITIN", "TIBC", "IRON", "RETICCTPCT" in the last 72 hours. Sepsis Labs: No results for input(s): "PROCALCITON", "LATICACIDVEN" in the last 168 hours.  No results found for this or any previous visit (from the past 240 hours).       Radiology Studies: No results found.      Scheduled Meds: Continuous Infusions:  sodium chloride 125 mL/hr at 11/05/23 0136     LOS: 1 day    Time spent: 35 minutes.     Alba Cory, MD [facility]   If 7PM-7AM, please contact night-coverage [email or web address]  11/05/2023, 7:09 AM

## 2023-11-05 NOTE — Progress Notes (Signed)
 Patient's mother requested that patient will have no other visitors except her.

## 2023-11-06 DIAGNOSIS — T43621A Poisoning by amphetamines, accidental (unintentional), initial encounter: Secondary | ICD-10-CM | POA: Diagnosis not present

## 2023-11-06 LAB — BASIC METABOLIC PANEL
Anion gap: 7 (ref 5–15)
BUN: 6 mg/dL (ref 6–20)
CO2: 22 mmol/L (ref 22–32)
Calcium: 8.9 mg/dL (ref 8.9–10.3)
Chloride: 107 mmol/L (ref 98–111)
Creatinine, Ser: 0.78 mg/dL (ref 0.44–1.00)
GFR, Estimated: >60 mL/min (ref >60)
Glucose, Bld: 96 mg/dL (ref 70–99)
Potassium: 3.8 mmol/L (ref 3.5–5.1)
Sodium: 136 mmol/L (ref 135–145)

## 2023-11-06 LAB — VITAMIN B12: Vitamin B-12: 340 pg/mL (ref 180–914)

## 2023-11-06 LAB — CBC
HCT: 41.7 % (ref 36.0–46.0)
Hemoglobin: 14.1 g/dL (ref 12.0–15.0)
MCH: 32.1 pg (ref 26.0–34.0)
MCHC: 33.8 g/dL (ref 30.0–36.0)
MCV: 95 fL (ref 80.0–100.0)
Platelets: 274 10*3/uL (ref 150–400)
RBC: 4.39 MIL/uL (ref 3.87–5.11)
RDW: 12.5 % (ref 11.5–15.5)
WBC: 12.6 10*3/uL — ABNORMAL HIGH (ref 4.0–10.5)
nRBC: 0 % (ref 0.0–0.2)

## 2023-11-06 LAB — AMMONIA: Ammonia: 34 umol/L (ref 9–35)

## 2023-11-06 LAB — TSH: TSH: 1.482 u[IU]/mL (ref 0.350–4.500)

## 2023-11-06 LAB — PROCALCITONIN: Procalcitonin: 0.11 ng/mL

## 2023-11-06 MED ORDER — GUAIFENESIN 100 MG/5ML PO LIQD
5.0000 mL | ORAL | Status: AC | PRN
  Administered 2023-11-06 – 2023-11-07 (×4): 5 mL via ORAL
  Filled 2023-11-06 (×4): qty 10

## 2023-11-06 MED ORDER — LACTATED RINGERS IV SOLN: INTRAVENOUS | Status: DC

## 2023-11-06 NOTE — Plan of Care (Signed)
  Problem: Health Behavior/Discharge Planning: Goal: Ability to manage health-related needs will improve Outcome: Not Progressing   Problem: Coping: Goal: Level of anxiety will decrease Outcome: Not Progressing   Problem: Elimination: Goal: Will not experience complications related to urinary retention Outcome: Not Progressing   Problem: Safety: Goal: Ability to remain free from injury will improve Outcome: Not Progressing

## 2023-11-06 NOTE — Plan of Care (Signed)

## 2023-11-06 NOTE — Consult Note (Signed)
 Patient seen briefly and difficult to arouse. Telemetry machine was reading apenic--consistent with her level of deep sleep. Provider discussed with safety sitter, who reports patient has been sleeping most of the day but has no safety or behavioral concerns. She is advised if patient wakes up and becomes more responsive to notify me via secure chat. At this time patient remains under IVC for a suicide attempt via an overdose of Ecstasy. Psychiatry consult service will continue to monitor. Patient will be medically cleared tomorrow and can be faxed out at that time.

## 2023-11-06 NOTE — Progress Notes (Addendum)
 PROGRESS NOTE    Chailyn Racette  ZOX:[id number] DOB: 2000-09-30 DOA: 11/04/2023 PCP: Dahlia Byes, MD   Brief Narrative: 23 past medical history significant for depression, anxiety, asthma was brought in for confusion after ingestion of unknown quantities of methylphenidate tablets as well as Tylenol.  Patient was having bizarre behavior, racing thoughts, pressured speech.  EMS was contacted.  Patient was IVC.  Patient admitted for methylphenidate overdose   Assessment & Plan:   Principal Problem:   Overdose by amphetamine, accidental or unintentional, initial encounter [facility]) Active Problems:   MDD (major depressive disorder), recurrent severe, without psychosis (HCC)   Asthma, chronic   Anxiety disorder   Hypokalemia   Drug overdose of undetermined intent   1-Drug overdose with methylphenidate -Patient presented after overdose of methylphenidate and Tylenol, unknown amount -Poison control was contacted and recommended Tylenol levels at 4 hours, observation, CK level, EKG. -Ativan as needed -Continue with IV fluids -Psych  consulted, recommend inpatient psychiatric admission.  -Patient was IVC on admission -Monitor on telemetry.  -Tylenol level less than 10 -UDS positive for cocaine and marihuana.   Depression with anxiety -psych consulted.  Started on Zoloft and Abilify.   Hypokalemia -Replaced.   History of asthma -PRN albuterol.   Cough; chest x ray negative.  Tessalon Pearl PRN Leukocytosis, trending down.  Pro-calcitonin not significantly elevated.   Estimated body mass index is 20.12 kg/m as calculated from the following:   Height as of this encounter: 5\' 2"  (1.575 m).   Weight as of this encounter: 49.9 kg.   DVT prophylaxis: SCD Code Status: Full code Family Communication: Disposition Plan:  Status is: Inpatient Remains inpatient appropriate because: management of Overdose. Plan to monitor tachycardia for another 24 hours. Plan to transfer  to psychiatric facility tomorrow.     Consultants:  Psych  Procedures:  none  Antimicrobials:    Subjective: She denies dyspnea, chest pain.    Objective: Vitals:   11/06/23 0355 11/06/23 0500 11/06/23 0612 11/06/23 0809  BP: (!) 129/91  (!) 131/90   Pulse: 96 89 91   Resp: (!) 29 (!) 29 (!) 24   Temp:   (!) 97.5 F (36.4 C) 98.7 F (37.1 C)  TempSrc:   Axillary Oral  SpO2: 97% 95% 97%   Weight:      Height:        Intake/Output Summary (Last 24 hours) at 11/06/2023 0848 Last data filed at 11/06/2023 8119 Gross per 24 hour  Intake 3147.59 ml  Output --  Net 3147.59 ml   Filed Weights   11/04/23 2126  Weight: 49.9 kg    Examination:  General exam: NAD Respiratory system: CTA Cardiovascular system: S 1, S 2 RRR Gastrointestinal system: BS present, soft, nt Central nervous system: alert Extremities: Symmetric 5 x 5 power.   Data Reviewed: I have personally reviewed following labs and imaging studies  CBC: Recent Labs  Lab 11/05/23 0810 11/06/23 0541  WBC 14.8* 12.6*  NEUTROABS 11.0*  --   HGB 13.3 14.1  HCT 39.5 41.7  MCV 93.4 95.0  PLT 278 274   Basic Metabolic Panel: Recent Labs  Lab 11/04/23 2205 11/05/23 0809 11/06/23 0541  NA 135 137 136  K 3.2* 4.3 3.8  CL 101 110 107  CO2 22 20* 22  GLUCOSE 103* 93 96  BUN 9 8 6   CREATININE 0.98 0.81 0.78  CALCIUM 9.1 8.4* 8.9  MG 1.8  --   --    GFR: Estimated Creatinine Clearance: 87.6  mL/min (by C-G formula based on SCr of 0.78 mg/dL). Liver Function Tests: Recent Labs  Lab 11/04/23 2205 11/05/23 0809  AST 22 16  ALT 15 11  ALKPHOS 106 84  BILITOT 1.8* 1.3*  PROT 8.0 6.4*  ALBUMIN 4.0 3.2*   No results for input(s): "LIPASE", "AMYLASE" in the last 168 hours. No results for input(s): "AMMONIA" in the last 168 hours. Coagulation Profile: No results for input(s): "INR", "PROTIME" in the last 168 hours. Cardiac Enzymes: No results for input(s): "CKTOTAL", "CKMB", "CKMBINDEX",  "TROPONINI" in the last 168 hours. BNP (last 3 results) No results for input(s): "PROBNP" in the last 8760 hours. HbA1C: No results for input(s): "HGBA1C" in the last 72 hours. CBG: No results for input(s): "GLUCAP" in the last 168 hours. Lipid Profile: No results for input(s): "CHOL", "HDL", "LDLCALC", "TRIG", "CHOLHDL", "LDLDIRECT" in the last 72 hours. Thyroid Function Tests: No results for input(s): "TSH", "T4TOTAL", "FREET4", "T3FREE", "THYROIDAB" in the last 72 hours. Anemia Panel: No results for input(s): "VITAMINB12", "FOLATE", "FERRITIN", "TIBC", "IRON", "RETICCTPCT" in the last 72 hours. Sepsis Labs: No results for input(s): "PROCALCITON", "LATICACIDVEN" in the last 168 hours.  Recent Results (from the past 240 hours)  Resp panel by RT-PCR (RSV, Flu A&B, Covid) Anterior Nasal Swab     Status: None   Collection Time: 11/05/23 11:59 AM   Specimen: Anterior Nasal Swab  Result Value Ref Range Status   SARS Coronavirus 2 by RT PCR NEGATIVE NEGATIVE Final    Comment: (NOTE) SARS-CoV-2 target nucleic acids are NOT DETECTED.  The SARS-CoV-2 RNA is generally detectable in upper respiratory specimens during the acute phase of infection. The lowest concentration of SARS-CoV-2 viral copies this assay can detect is 138 copies/mL. A negative result does not preclude SARS-Cov-2 infection and should not be used as the sole basis for treatment or other patient management decisions. A negative result may occur with  improper specimen collection/handling, submission of specimen other than nasopharyngeal swab, presence of viral mutation(s) within the areas targeted by this assay, and inadequate number of viral copies(<138 copies/mL). A negative result must be combined with clinical observations, patient history, and epidemiological information. The expected result is Negative.  Fact Sheet for Patients:  BloggerCourse.com  Fact Sheet for Healthcare Providers:   SeriousBroker.it  This test is no t yet approved or cleared by the [place] FDA and  has been authorized for detection and/or diagnosis of SARS-CoV-2 by FDA under an Emergency Use Authorization (EUA). This EUA will remain  in effect (meaning this test can be used) for the duration of the COVID-19 declaration under Section 564(b)(1) of the Act, 21 U.S.C.section 360bbb-3(b)(1), unless the authorization is terminated  or revoked sooner.       Influenza A by PCR NEGATIVE NEGATIVE Final   Influenza B by PCR NEGATIVE NEGATIVE Final    Comment: (NOTE) The Xpert Xpress SARS-CoV-2/FLU/RSV plus assay is intended as an aid in the diagnosis of influenza from Nasopharyngeal swab specimens and should not be used as a sole basis for treatment. Nasal washings and aspirates are unacceptable for Xpert Xpress SARS-CoV-2/FLU/RSV testing.  Fact Sheet for Patients: BloggerCourse.com  Fact Sheet for Healthcare Providers: SeriousBroker.it  This test is not yet approved or cleared by the [place] FDA and has been authorized for detection and/or diagnosis of SARS-CoV-2 by FDA under an Emergency Use Authorization (EUA). This EUA will remain in effect (meaning this test can be used) for the duration of the COVID-19 declaration under Section 564(b)(1) of the  Act, 21 U.S.C. section 360bbb-3(b)(1), unless the authorization is terminated or revoked.     Resp Syncytial Virus by PCR NEGATIVE NEGATIVE Final    Comment: (NOTE) Fact Sheet for Patients: BloggerCourse.com  Fact Sheet for Healthcare Providers: SeriousBroker.it  This test is not yet approved or cleared by the [place] FDA and has been authorized for detection and/or diagnosis of SARS-CoV-2 by FDA under an Emergency Use Authorization (EUA). This EUA will remain in effect (meaning this test can be used) for  the duration of the COVID-19 declaration under Section 564(b)(1) of the Act, 21 U.S.C. section 360bbb-3(b)(1), unless the authorization is terminated or revoked.  Performed at [facility], [address] [place], [place] [address]   MRSA Next Gen by PCR, Nasal     Status: None   Collection Time: 11/05/23  3:20 PM   Specimen: Nasal Mucosa; Nasal Swab  Result Value Ref Range Status   MRSA by PCR Next Gen NOT DETECTED NOT DETECTED Final    Comment: (NOTE) The GeneXpert MRSA Assay (FDA approved for NASAL specimens only), is one component of a comprehensive MRSA colonization surveillance program. It is not intended to diagnose MRSA infection nor to guide or monitor treatment for MRSA infections. Test performance is not FDA approved in patients less than 27 years old. Performed at [facility], [address], [place], [place] [address]          Radiology Studies: DG CHEST PORT 1 VIEW Result Date: 11/05/2023 CLINICAL DATA:  Possible overdose.  Cough. EXAM: PORTABLE CHEST 1 VIEW COMPARISON:  09/01/2016. FINDINGS: Trachea is midline. Heart size normal. Lungs are clear. No pleural fluid. IMPRESSION: No acute findings. Electronically Signed   By: Leanna Battles M.D.   On: 11/05/2023 09:56        Scheduled Meds:  ARIPiprazole  7.5 mg Oral Daily   Chlorhexidine Gluconate Cloth  6 each Topical Daily   nicotine  21 mg Transdermal Daily   pantoprazole  40 mg Oral BID   sertraline  50 mg Oral Daily   Continuous Infusions:  lactated ringers       LOS: 2 days    Time spent: 35 minutes.     Alba Cory, MD [facility]   If 7PM-7AM, please contact night-coverage [email or web address]  11/06/2023, 8:48 AM

## 2023-11-06 NOTE — TOC Initial Note (Addendum)
 Transition of Care [facility]) - Initial/Assessment Note    Patient Details  Name: Kelly Baker MRN: [id number] Date of Birth: [birth year]  [facility]) CM/SW Contact:    Lanier Clam, RN Phone Number: 11/06/2023, 11:49 AM  Clinical Narrative: Patient IVC 3/23-3/29/25-per [facility] rep Danika no beds today-will check tomorrow. Also faxed out to [facility], [place], [facility], [place], [facility]-await bed offers. Once no offers of 3 will place on [facility] waitlist.    -1:11p  [facility] for IP Psych rep Lowella Dandy tel#[phone] has a bed tomorrow if stable can accept. Please let me know so I can get back to [facility].MD updated. -d/c tomorrow/accepted: to [facility] Dr. Liane Comber unit [address] [facility] [address] [phone], report tel# [phone]. Transport by sheriff-IVC.  -4p-informed patient of d/c tomorrow if stable to [facility];she agreed for me to call her mother Lelon Mast on contact list-left vm.   Expected Discharge Plan: Psychiatric Hospital Barriers to Discharge: Continued Medical Work up   Patient Goals and CMS Choice Patient states their goals for this hospitalization and ongoing recovery are:: Psych facility CMS Medicare.gov Compare Post Acute Care list provided to:: Patient Choice offered to / list presented to : Patient [place] ownership interest in [facility].provided to:: Patient    Expected Discharge Plan and Services   Discharge Planning Services: CM Consult Post Acute Care Choice:  (IP Psych) Living arrangements for the past 2 months: Single Family Home                                      Prior Living Arrangements/Services Living arrangements for the past 2 months: Single Family Home Lives with:: Relatives   Do you feel safe going back to the place where you live?: Yes               Activities of Daily Living   ADL Screening (condition  at time of admission) Independently performs ADLs?: Yes (appropriate for developmental age) Is the patient deaf or have difficulty hearing?: No Does the patient have difficulty seeing, even when wearing glasses/contacts?: No Does the patient have difficulty concentrating, remembering, or making decisions?: No  Permission Sought/Granted Permission sought to share information with : Case Manager Permission granted to share information with : Yes, Verbal Permission Granted              Emotional Assessment              Admission diagnosis:  Overdose by amphetamine, accidental or unintentional, initial encounter (HCC) [Z36.644I] Drug overdose of undetermined intent [T50.904A] Overdose of undetermined intent, initial encounter [T50.904A] Patient Active Problem List   Diagnosis Date Noted   Asthma, chronic 11/04/2023   Anxiety disorder 11/04/2023   Overdose by amphetamine, accidental or unintentional, initial encounter (HCC) 11/04/2023   Hypokalemia 11/04/2023   Drug overdose of undetermined intent 11/04/2023   Nexplanon in place 06/03/2019   MDD (major depressive disorder), recurrent severe, without psychosis (HCC) 02/01/2018   PCP:  Dahlia Byes, MD Pharmacy:   [facility] [phone] - [address], [place] - [address] AT [address] [place] [address] Phone: [phone] Fax: [phone]     Social Drivers of Health (SDOH) Social History: SDOH Screenings   Food Insecurity: Food Insecurity Present (11/05/2023)  Housing: High Risk (11/05/2023)  Transportation Needs: Unmet Transportation Needs (11/05/2023)  Utilities:  Not At Risk (11/05/2023)  Tobacco Use: High Risk (11/04/2023)   SDOH Interventions:     Readmission Risk Interventions     No data to display

## 2023-11-07 DIAGNOSIS — T43621A Poisoning by amphetamines, accidental (unintentional), initial encounter: Secondary | ICD-10-CM | POA: Diagnosis not present

## 2023-11-07 MED ORDER — BENZONATATE 100 MG PO CAPS: 100.0000 mg | ORAL_CAPSULE | Freq: Three times a day (TID) | ORAL | 0 refills | Status: AC | PRN

## 2023-11-07 MED ORDER — ARIPIPRAZOLE 15 MG PO TABS: 7.5000 mg | ORAL_TABLET | Freq: Every day | ORAL | 0 refills | Status: AC

## 2023-11-07 MED ORDER — LORAZEPAM 2 MG/ML IJ SOLN
0.5000 mg | INTRAMUSCULAR | Status: DC | PRN
  Administered 2023-11-07: 0.5 mg via INTRAVENOUS
  Filled 2023-11-07: qty 1

## 2023-11-07 MED ORDER — NICOTINE 21 MG/24HR TD PT24: 21.0000 mg | MEDICATED_PATCH | Freq: Every day | TRANSDERMAL | 0 refills | Status: AC

## 2023-11-07 MED ORDER — PANTOPRAZOLE SODIUM 40 MG PO TBEC: 40.0000 mg | DELAYED_RELEASE_TABLET | Freq: Every day | ORAL | 0 refills | Status: AC

## 2023-11-07 MED ORDER — SERTRALINE HCL 50 MG PO TABS: 50.0000 mg | ORAL_TABLET | Freq: Every day | ORAL | 0 refills | Status: AC

## 2023-11-07 NOTE — Discharge Summary (Signed)
 Physician Discharge Summary   Patient: Kelly Baker MRN: [id number] DOB: 07/20/2001  Admit date:     11/04/2023  Discharge date: 11/07/23  Discharge Physician: Alba Cory   PCP: Dahlia Byes, MD   Recommendations at discharge:   Transfer to inpatient psych facility  Discharge Diagnoses: Principal Problem:   Overdose by amphetamine, accidental or unintentional, initial encounter [facility]) Active Problems:   MDD (major depressive disorder), recurrent severe, without psychosis (HCC)   Asthma, chronic   Anxiety disorder   Hypokalemia   Drug overdose of undetermined intent  Resolved Problems:   * No resolved hospital problems. [facility] Course: 22 past medical history significant for depression, anxiety, asthma was brought in for confusion after ingestion of unknown quantities of methylphenidate tablets as well as Tylenol.  Patient was having bizarre behavior, racing thoughts, pressured speech.  EMS was contacted.  Patient was IVC.  Patient admitted for methylphenidate overdose    Assessment and Plan: 1-Drug overdose with methylphenidate -Patient presented after overdose of methylphenidate and Tylenol, unknown amount -Poison control was contacted and recommended Tylenol levels at 4 hours, observation, CK level, EKG. -Ativan as needed -Continue with IV fluids -Psych  consulted, recommend inpatient psychiatric admission.  -Patient was IVC on admission -Monitor on telemetry.  -Tylenol level less than 10 -UDS positive for cocaine and marihuana.   Medical stable to be transfer to inpatient psych  facility   Depression with anxiety -psych consulted.  Started on Zoloft and Abilify.    Hypokalemia -Replaced.    History of asthma -PRN albuterol.    Cough; chest x ray negative.  Tessalon Pearl PRN Leukocytosis, trending down.  Pro-calcitonin not significantly elevated.    Estimated body mass index is 20.12 kg/m as calculated from the following:   Height as  of this encounter: 5\' 2"  (1.575 m).   Weight as of this encounter: 49.9 kg.            Consultants: Psych Procedures performed: None Disposition:  Inpatient Psych Facility  Diet recommendation:  Discharge Diet Orders (From admission, onward)     Start     Ordered   11/07/23 0000  Diet - low sodium heart healthy        11/07/23 0926           Regular diet DISCHARGE MEDICATION: Allergies as of 11/07/2023       Reactions   Other Other (See Comments)   Dog dander pt reports sneezing        Medication List     STOP taking these medications    cetirizine 10 MG tablet Commonly known as: ZYRTEC   ondansetron 4 MG disintegrating tablet Commonly known as: Zofran ODT   polyethylene glycol powder 17 GM/SCOOP powder Commonly known as: GLYCOLAX/MIRALAX       TAKE these medications    albuterol 108 (90 Base) MCG/ACT inhaler Commonly known as: VENTOLIN HFA   ARIPiprazole 15 MG tablet Commonly known as: ABILIFY Take 0.5 tablets (7.5 mg total) by mouth daily. Start taking on: November 08, 2023   benzonatate 100 MG capsule Commonly known as: TESSALON Take 1 capsule (100 mg total) by mouth 3 (three) times daily as needed for cough.   Nexplanon 68 MG Impl implant Generic drug: etonogestrel 1 each (68 mg total) by Subdermal route once.   nicotine 21 mg/24hr patch Commonly known as: NICODERM CQ - dosed in mg/24 hours Place 1 patch (21 mg total) onto the skin daily. Start taking on: November 08, 2023  pantoprazole 40 MG tablet Commonly known as: PROTONIX Take 1 tablet (40 mg total) by mouth daily.   sertraline 50 MG tablet Commonly known as: ZOLOFT Take 1 tablet (50 mg total) by mouth daily. Start taking on: November 08, 2023        Discharge Exam: Ceasar Mons Weights   11/04/23 2126  Weight: 49.9 kg   General; NAD  Condition at discharge: stable  The results of significant diagnostics from this hospitalization (including imaging, microbiology, ancillary and  laboratory) are listed below for reference.   Imaging Studies: DG CHEST PORT 1 VIEW Result Date: 11/05/2023 CLINICAL DATA:  Possible overdose.  Cough. EXAM: PORTABLE CHEST 1 VIEW COMPARISON:  09/01/2016. FINDINGS: Trachea is midline. Heart size normal. Lungs are clear. No pleural fluid. IMPRESSION: No acute findings. Electronically Signed   By: Leanna Battles M.D.   On: 11/05/2023 09:56    Microbiology: Results for orders placed or performed during the hospital encounter of 11/04/23  Resp panel by RT-PCR (RSV, Flu A&B, Covid) Anterior Nasal Swab     Status: None   Collection Time: 11/05/23 11:59 AM   Specimen: Anterior Nasal Swab  Result Value Ref Range Status   SARS Coronavirus 2 by RT PCR NEGATIVE NEGATIVE Final    Comment: (NOTE) SARS-CoV-2 target nucleic acids are NOT DETECTED.  The SARS-CoV-2 RNA is generally detectable in upper respiratory specimens during the acute phase of infection. The lowest concentration of SARS-CoV-2 viral copies this assay can detect is 138 copies/mL. A negative result does not preclude SARS-Cov-2 infection and should not be used as the sole basis for treatment or other patient management decisions. A negative result may occur with  improper specimen collection/handling, submission of specimen other than nasopharyngeal swab, presence of viral mutation(s) within the areas targeted by this assay, and inadequate number of viral copies(<138 copies/mL). A negative result must be combined with clinical observations, patient history, and epidemiological information. The expected result is Negative.  Fact Sheet for Patients:  BloggerCourse.com  Fact Sheet for Healthcare Providers:  SeriousBroker.it  This test is no t yet approved or cleared by the [place] FDA and  has been authorized for detection and/or diagnosis of SARS-CoV-2 by FDA under an Emergency Use Authorization (EUA). This EUA will remain   in effect (meaning this test can be used) for the duration of the COVID-19 declaration under Section 564(b)(1) of the Act, 21 U.S.C.section 360bbb-3(b)(1), unless the authorization is terminated  or revoked sooner.       Influenza A by PCR NEGATIVE NEGATIVE Final   Influenza B by PCR NEGATIVE NEGATIVE Final    Comment: (NOTE) The Xpert Xpress SARS-CoV-2/FLU/RSV plus assay is intended as an aid in the diagnosis of influenza from Nasopharyngeal swab specimens and should not be used as a sole basis for treatment. Nasal washings and aspirates are unacceptable for Xpert Xpress SARS-CoV-2/FLU/RSV testing.  Fact Sheet for Patients: BloggerCourse.com  Fact Sheet for Healthcare Providers: SeriousBroker.it  This test is not yet approved or cleared by the [place] FDA and has been authorized for detection and/or diagnosis of SARS-CoV-2 by FDA under an Emergency Use Authorization (EUA). This EUA will remain in effect (meaning this test can be used) for the duration of the COVID-19 declaration under Section 564(b)(1) of the Act, 21 U.S.C. section 360bbb-3(b)(1), unless the authorization is terminated or revoked.     Resp Syncytial Virus by PCR NEGATIVE NEGATIVE Final    Comment: (NOTE) Fact Sheet for Patients: BloggerCourse.com  Fact Sheet for Healthcare Providers:  SeriousBroker.it  This test is not yet approved or cleared by the [place] and has been authorized for detection and/or diagnosis of SARS-CoV-2 by FDA under an Emergency Use Authorization (EUA). This EUA will remain in effect (meaning this test can be used) for the duration of the COVID-19 declaration under Section 564(b)(1) of the Act, 21 U.S.C. section 360bbb-3(b)(1), unless the authorization is terminated or revoked.  Performed at [facility], [address] [place], [place]  [address]   MRSA Next Gen by PCR, Nasal     Status: None   Collection Time: 11/05/23  3:20 PM   Specimen: Nasal Mucosa; Nasal Swab  Result Value Ref Range Status   MRSA by PCR Next Gen NOT DETECTED NOT DETECTED Final    Comment: (NOTE) The GeneXpert MRSA Assay (FDA approved for NASAL specimens only), is one component of a comprehensive MRSA colonization surveillance program. It is not intended to diagnose MRSA infection nor to guide or monitor treatment for MRSA infections. Test performance is not FDA approved in patients less than 61 years old. Performed at [facility], [address], [place], [place] [address]     Labs: CBC: Recent Labs  Lab 11/05/23 0810 11/06/23 0541  WBC 14.8* 12.6*  NEUTROABS 11.0*  --   HGB 13.3 14.1  HCT 39.5 41.7  MCV 93.4 95.0  PLT 278 274   Basic Metabolic Panel: Recent Labs  Lab 11/04/23 2205 11/05/23 0809 11/06/23 0541  NA 135 137 136  K 3.2* 4.3 3.8  CL 101 110 107  CO2 22 20* 22  GLUCOSE 103* 93 96  BUN 9 8 6   CREATININE 0.98 0.81 0.78  CALCIUM 9.1 8.4* 8.9  MG 1.8  --   --    Liver Function Tests: Recent Labs  Lab 11/04/23 2205 11/05/23 0809  AST 22 16  ALT 15 11  ALKPHOS 106 84  BILITOT 1.8* 1.3*  PROT 8.0 6.4*  ALBUMIN 4.0 3.2*   CBG: No results for input(s): "GLUCAP" in the last 168 hours.  Discharge time spent: greater than 30 minutes.  Signed: Alba Cory, MD [facility] 11/07/2023

## 2023-11-07 NOTE — TOC Transition Note (Signed)
 Transition of Care [facility]) - Discharge Note   Patient Details  Name: Kelly Baker MRN: [id number] Date of Birth: [birth year]  Transition of Care [facility]) CM/SW Contact:  Otelia Santee, LCSW Phone Number: 11/07/2023, 9:51 AM   Clinical Narrative:    [facility] to [facility] for [facility]. Accepting provider is Dr. Forrestine Him. Pt will be going to [facility] 2[facility] unit. RN to call report to Amy at [phone]. DC packet with 3 copies of IVC placed at RN station. Sheriffs department has been called for transportation and are to call RN when on their way for pick up.    Final next level of care: Psychiatric Hospital Barriers to Discharge: Barriers Resolved   Patient Goals and CMS Choice Patient states their goals for this hospitalization and ongoing recovery are:: Psych facility CMS Medicare.gov Compare Post Acute Care list provided to:: Patient Choice offered to / list presented to : Patient [place] ownership interest in [facility].provided to:: Patient    Discharge Placement                       Discharge Plan and Services Additional resources added to the After Visit Summary for     Discharge Planning Services: CM Consult Post Acute Care Choice:  (IP Psych)          DME Arranged: N/A DME Agency: NA                  Social Drivers of Health (SDOH) Interventions SDOH Screenings   Food Insecurity: No Food Insecurity (11/06/2023)  Recent Concern: Food Insecurity - Food Insecurity Present (11/05/2023)  Housing: Low Risk  (11/06/2023)  Recent Concern: Housing - High Risk (11/05/2023)  Transportation Needs: No Transportation Needs (11/06/2023)  Recent Concern: Transportation Needs - Unmet Transportation Needs (11/05/2023)  Utilities: Not At Risk (11/05/2023)  Tobacco Use: High Risk (11/04/2023)     Readmission Risk Interventions    11/07/2023    9:50 AM  Readmission Risk Prevention Plan  Post Dischage Appt Complete  Medication  Screening Complete  Transportation Screening Complete

## 2023-11-07 NOTE — Plan of Care (Signed)

## 2023-11-07 NOTE — Progress Notes (Signed)
 Telephone call to [facility]. Report given to receiving RN Karie Soda Luckett.

## 2023-11-09 LAB — VITAMIN B1: Vitamin B1 (Thiamine): 110.4 nmol/L (ref 66.5–200.0)

## 2024-09-05 ENCOUNTER — Emergency Department (HOSPITAL_BASED_OUTPATIENT_CLINIC_OR_DEPARTMENT_OTHER)

## 2024-09-05 ENCOUNTER — Emergency Department (HOSPITAL_COMMUNITY): Admission: EM | Admit: 2024-09-05 | Discharge: 2024-09-05 | Discharge status: Against Medical Advice

## 2024-09-05 ENCOUNTER — Encounter (HOSPITAL_BASED_OUTPATIENT_CLINIC_OR_DEPARTMENT_OTHER): Payer: Self-pay

## 2024-09-05 ENCOUNTER — Emergency Department (HOSPITAL_BASED_OUTPATIENT_CLINIC_OR_DEPARTMENT_OTHER)
Admission: EM | Admit: 2024-09-05 | Discharge: 2024-09-05 | Disposition: A | Discharge status: Home / Self Care | Attending: Emergency Medicine | Admitting: Emergency Medicine

## 2024-09-05 ENCOUNTER — Other Ambulatory Visit: Payer: Self-pay

## 2024-09-05 ENCOUNTER — Encounter (HOSPITAL_COMMUNITY): Payer: Self-pay

## 2024-09-05 DIAGNOSIS — R0789 Other chest pain: Secondary | ICD-10-CM | POA: Insufficient documentation

## 2024-09-05 DIAGNOSIS — R6884 Jaw pain: Secondary | ICD-10-CM | POA: Diagnosis not present

## 2024-09-05 DIAGNOSIS — S01511A Laceration without foreign body of lip, initial encounter: Secondary | ICD-10-CM | POA: Diagnosis not present

## 2024-09-05 DIAGNOSIS — M542 Cervicalgia: Secondary | ICD-10-CM | POA: Insufficient documentation

## 2024-09-05 DIAGNOSIS — S60222A Contusion of left hand, initial encounter: Secondary | ICD-10-CM | POA: Diagnosis not present

## 2024-09-05 DIAGNOSIS — R1012 Left upper quadrant pain: Secondary | ICD-10-CM | POA: Diagnosis not present

## 2024-09-05 DIAGNOSIS — M79652 Pain in left thigh: Secondary | ICD-10-CM | POA: Diagnosis not present

## 2024-09-05 DIAGNOSIS — R42 Dizziness and giddiness: Secondary | ICD-10-CM | POA: Insufficient documentation

## 2024-09-05 DIAGNOSIS — S0993XA Unspecified injury of face, initial encounter: Secondary | ICD-10-CM | POA: Diagnosis present

## 2024-09-05 DIAGNOSIS — S40022A Contusion of left upper arm, initial encounter: Secondary | ICD-10-CM | POA: Insufficient documentation

## 2024-09-05 DIAGNOSIS — S5012XA Contusion of left forearm, initial encounter: Secondary | ICD-10-CM | POA: Insufficient documentation

## 2024-09-05 DIAGNOSIS — R1032 Left lower quadrant pain: Secondary | ICD-10-CM | POA: Diagnosis not present

## 2024-09-05 DIAGNOSIS — Z5321 Procedure and treatment not carried out due to patient leaving prior to being seen by health care provider: Secondary | ICD-10-CM | POA: Insufficient documentation

## 2024-09-05 DIAGNOSIS — R112 Nausea with vomiting, unspecified: Secondary | ICD-10-CM | POA: Insufficient documentation

## 2024-09-05 LAB — CBC WITH DIFFERENTIAL/PLATELET
Abs Immature Granulocytes: 0.01 K/uL (ref 0.00–0.07)
Basophils Absolute: 0 K/uL (ref 0.0–0.1)
Basophils Relative: 1 %
Eosinophils Absolute: 0.1 K/uL (ref 0.0–0.5)
Eosinophils Relative: 2 %
HCT: 45.2 % (ref 36.0–46.0)
Hemoglobin: 15.8 g/dL — ABNORMAL HIGH (ref 12.0–15.0)
Immature Granulocytes: 0 %
Lymphocytes Relative: 32 %
Lymphs Abs: 2.5 K/uL (ref 0.7–4.0)
MCH: 31.9 pg (ref 26.0–34.0)
MCHC: 35 g/dL (ref 30.0–36.0)
MCV: 91.3 fL (ref 80.0–100.0)
Monocytes Absolute: 0.9 K/uL (ref 0.1–1.0)
Monocytes Relative: 11 %
Neutro Abs: 4.3 K/uL (ref 1.7–7.7)
Neutrophils Relative %: 54 %
Platelets: 325 K/uL (ref 150–400)
RBC: 4.95 MIL/uL (ref 3.87–5.11)
RDW: 12.8 % (ref 11.5–15.5)
WBC: 7.8 K/uL (ref 4.0–10.5)
nRBC: 0 % (ref 0.0–0.2)

## 2024-09-05 LAB — COMPREHENSIVE METABOLIC PANEL WITH GFR
ALT: 27 U/L (ref 0–44)
AST: 54 U/L — ABNORMAL HIGH (ref 15–41)
Albumin: 4.5 g/dL (ref 3.5–5.0)
Alkaline Phosphatase: 105 U/L (ref 38–126)
Anion gap: 11 (ref 5–15)
BUN: 15 mg/dL (ref 6–20)
CO2: 22 mmol/L (ref 22–32)
Calcium: 8.9 mg/dL (ref 8.9–10.3)
Chloride: 103 mmol/L (ref 98–111)
Creatinine, Ser: 0.9 mg/dL (ref 0.44–1.00)
GFR, Estimated: >60 mL/min
Glucose, Bld: 83 mg/dL (ref 70–99)
Potassium: 4 mmol/L (ref 3.5–5.1)
Sodium: 137 mmol/L (ref 135–145)
Total Bilirubin: 1.1 mg/dL (ref 0.0–1.2)
Total Protein: 7.3 g/dL (ref 6.5–8.1)

## 2024-09-05 LAB — HCG, SERUM, QUALITATIVE: Preg, Serum: NEGATIVE

## 2024-09-05 MED ORDER — SODIUM CHLORIDE 0.9 % IV BOLUS
1000.0000 mL | Freq: Once | INTRAVENOUS | Status: AC
  Administered 2024-09-05: 1000 mL via INTRAVENOUS

## 2024-09-05 MED ORDER — MORPHINE SULFATE (PF) 4 MG/ML IV SOLN
4.0000 mg | Freq: Once | INTRAVENOUS | Status: AC
  Administered 2024-09-05: 4 mg via INTRAVENOUS
  Filled 2024-09-05: qty 1

## 2024-09-05 MED ORDER — NAPROXEN 500 MG PO TABS: 500.0000 mg | ORAL_TABLET | Freq: Two times a day (BID) | ORAL | 0 refills | Status: AC

## 2024-09-05 MED ORDER — METHOCARBAMOL 500 MG PO TABS
500.0000 mg | ORAL_TABLET | Freq: Once | ORAL | Status: AC
  Administered 2024-09-05: 500 mg via ORAL
  Filled 2024-09-05: qty 1

## 2024-09-05 MED ORDER — METHOCARBAMOL 500 MG PO TABS: 500.0000 mg | ORAL_TABLET | Freq: Two times a day (BID) | ORAL | 0 refills | Status: AC

## 2024-09-05 MED ORDER — IOHEXOL 300 MG/ML  SOLN
75.0000 mL | Freq: Once | INTRAMUSCULAR | Status: AC | PRN
  Administered 2024-09-05: 75 mL via INTRAVENOUS

## 2024-09-05 MED ORDER — ONDANSETRON HCL 4 MG/2ML IJ SOLN
4.0000 mg | Freq: Once | INTRAMUSCULAR | Status: AC
  Administered 2024-09-05: 4 mg via INTRAVENOUS
  Filled 2024-09-05: qty 2
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
## 2024-09-05 NOTE — ED Notes (Signed)
 Pt did not answer to be taken back called 3x
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
## 2024-09-05 NOTE — ED Notes (Signed)
 ED Provider at bedside.
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
## 2024-09-05 NOTE — ED Notes (Signed)
 Pt transported to CT ?

## 2024-09-05 NOTE — Discharge Instructions (Signed)
 It was a pleasure taking care of you here today  As we discussed your imaging did not show any evidence of broken bones or any organ damage and your blood work was reassuring  Would place cool compress for the first 24 hours and then warm compress to the area of your bruising.  I have given you prescription for Robaxin  which is a muscle relaxer.  This may make you sleepy.  I have also written a prescription for naproxen  which is an anti-inflammatory.  Do not take any other additional ibuprofen or Aleve  containing products while taking this medication  Make sure to follow-up outpatient, return for any worsening symptoms
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
